# Patient Record
Sex: Female | Born: 1975 | Race: White | Hispanic: No | Marital: Single | Smoking: Never smoker
Health system: Southern US, Community
[De-identification: ages and names within clinical notes are randomized; demographics above are authoritative.]

## PROBLEM LIST (undated history)

## (undated) DIAGNOSIS — F419 Anxiety disorder, unspecified: Secondary | ICD-10-CM

## (undated) DIAGNOSIS — M1990 Unspecified osteoarthritis, unspecified site: Secondary | ICD-10-CM

## (undated) HISTORY — PX: SHOULDER SURGERY: SHX246

## (undated) HISTORY — PX: KNEE SURGERY: SHX244

## (undated) HISTORY — DX: Anxiety disorder, unspecified: F41.9

---

## 2004-10-24 ENCOUNTER — Ambulatory Visit: Payer: Self-pay | Admitting: Unknown Physician Specialty

## 2006-04-09 ENCOUNTER — Emergency Department: Payer: Self-pay | Admitting: Emergency Medicine

## 2010-05-23 ENCOUNTER — Emergency Department: Payer: Self-pay | Admitting: Emergency Medicine

## 2010-10-06 DIAGNOSIS — S83209A Unspecified tear of unspecified meniscus, current injury, unspecified knee, initial encounter: Secondary | ICD-10-CM | POA: Insufficient documentation

## 2010-10-06 DIAGNOSIS — M179 Osteoarthritis of knee, unspecified: Secondary | ICD-10-CM | POA: Insufficient documentation

## 2012-05-08 ENCOUNTER — Ambulatory Visit: Payer: Self-pay | Admitting: Specialist

## 2012-08-19 IMAGING — US US EXTREM LOW VENOUS*R*
1 series · 18 of 24 positions shown · non-contrast
Comparison: none

REASON FOR EXAM: right leg pain/swelling
COMMENTS:

PROCEDURE:     US  - US DOPPLER LOW EXTR RIGHT  - May 24, 2010  [DATE]
RESULT:     Right lower extremity color flow duplex Doppler reveals no
evidence of deep venous thrombosis. Color flow analysis, Doppler analysis
and compression studies are normal.

[Series 1: us extrem low venous*right* · 18 of 24 slices shown]
[im 1/24]
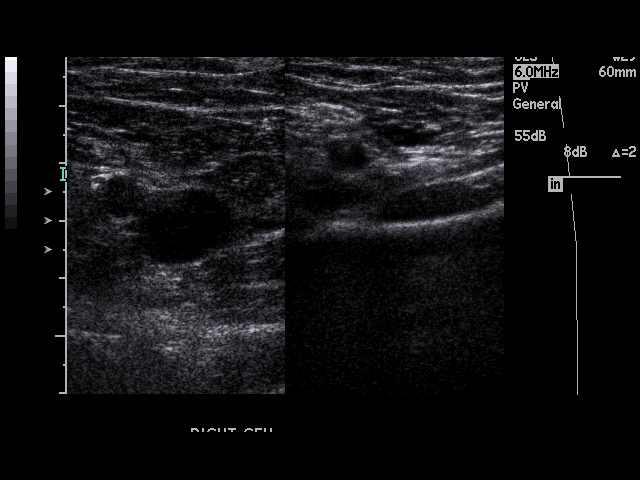
[im 3/24]
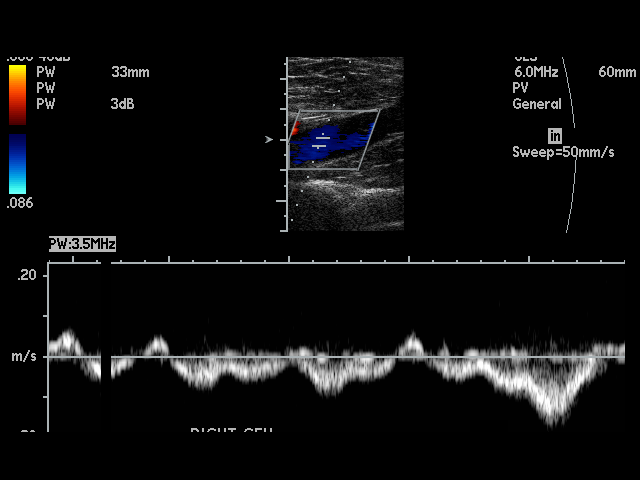
[im 4/24]
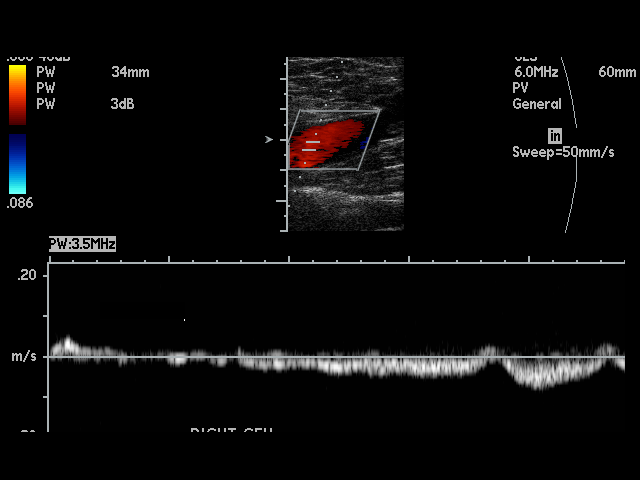
[im 5/24]
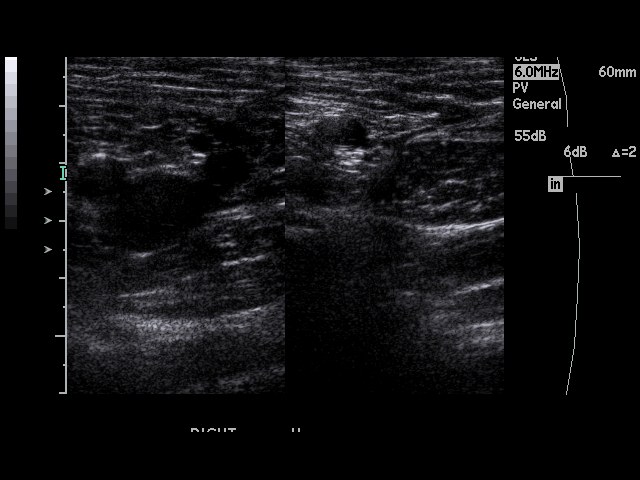
[im 7/24]
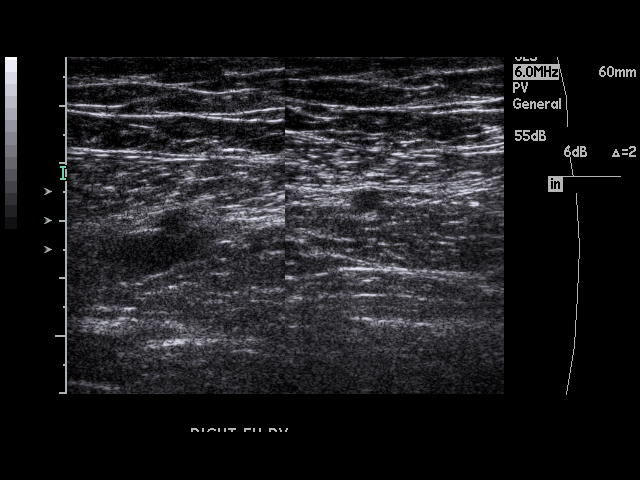
[im 8/24]
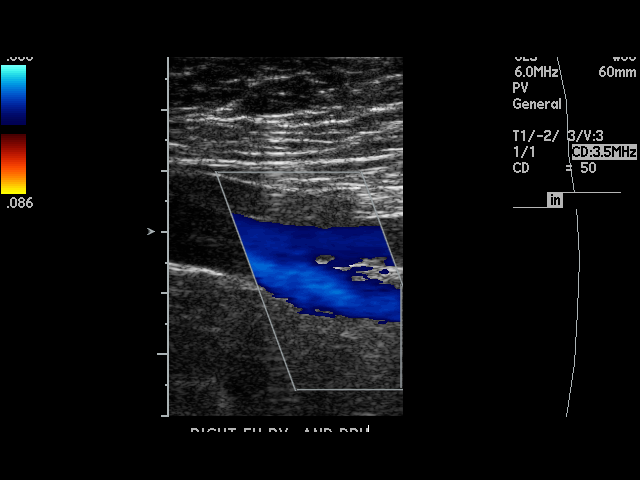
[im 9/24]
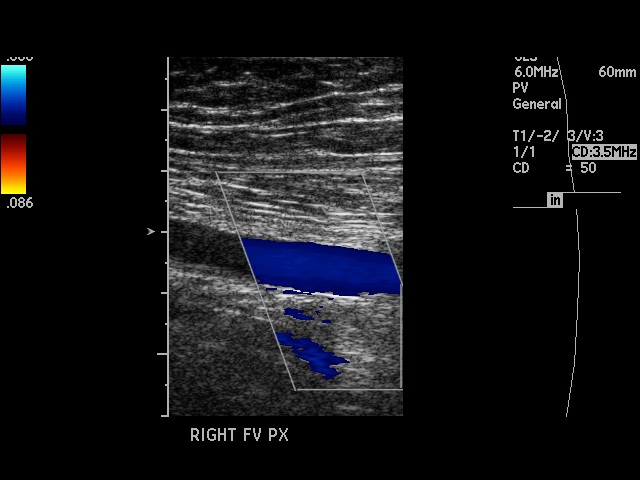
[im 11/24]
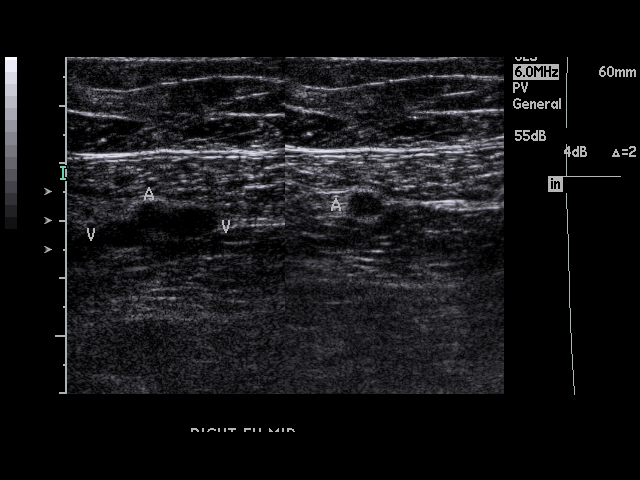
[im 12/24]
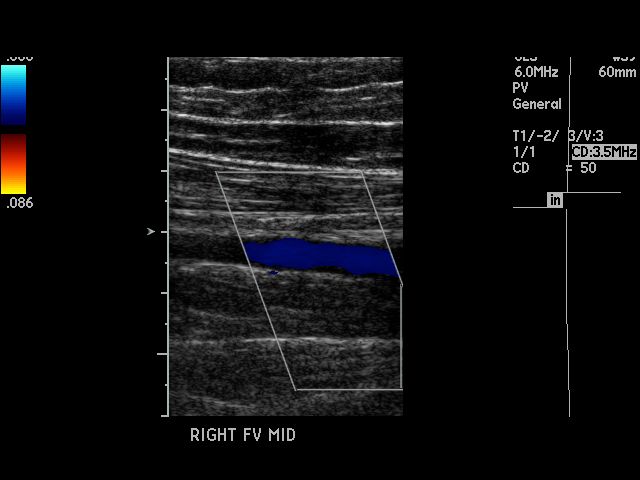
[im 13/24]
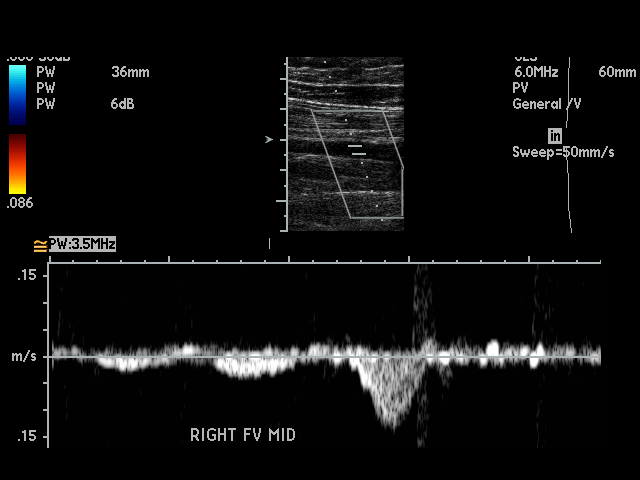
[im 15/24]
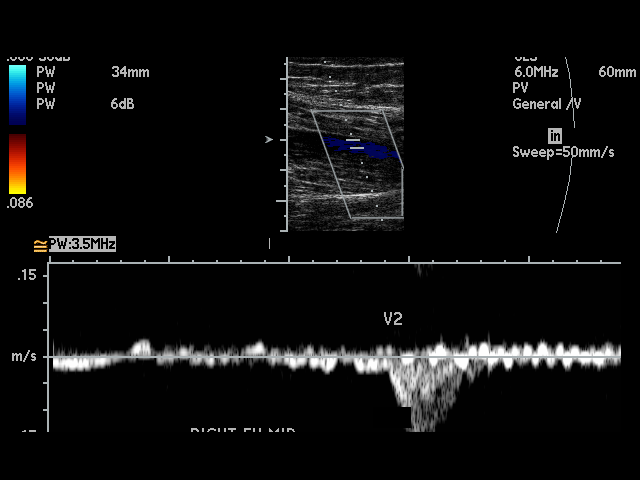
[im 16/24]
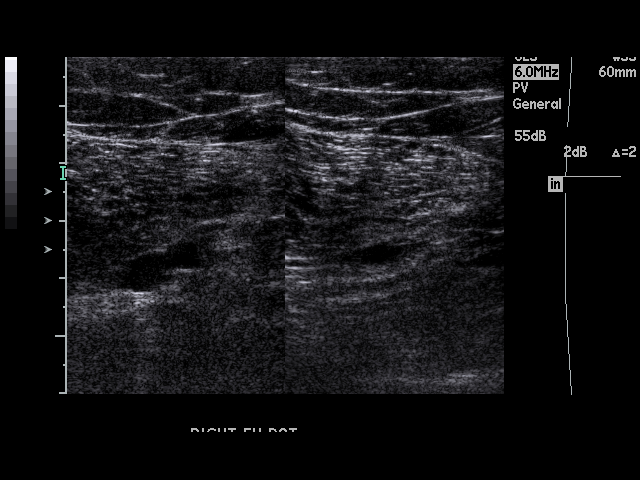
[im 17/24]
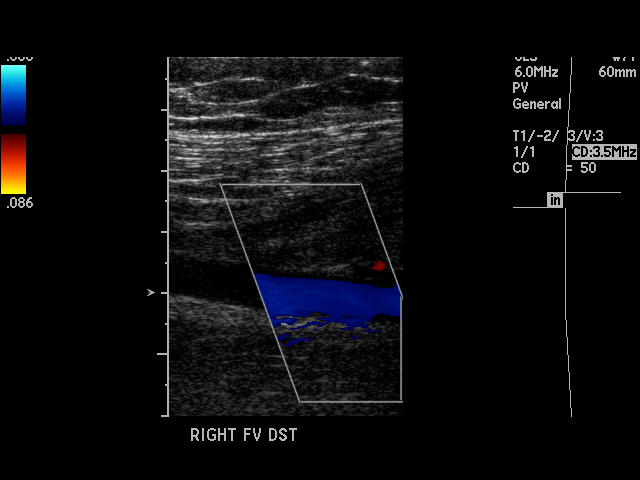
[im 19/24]
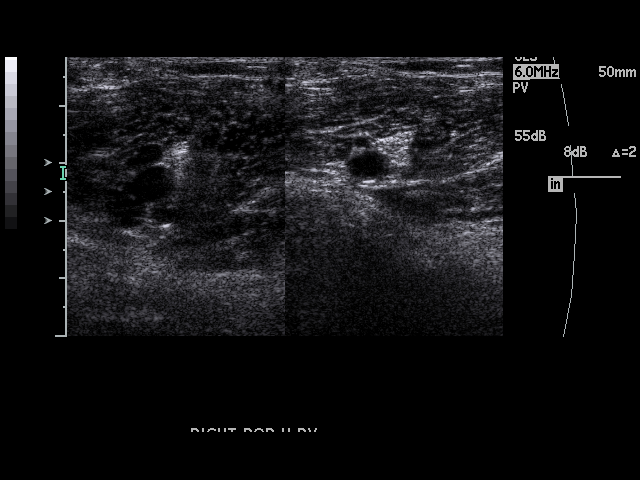
[im 20/24]
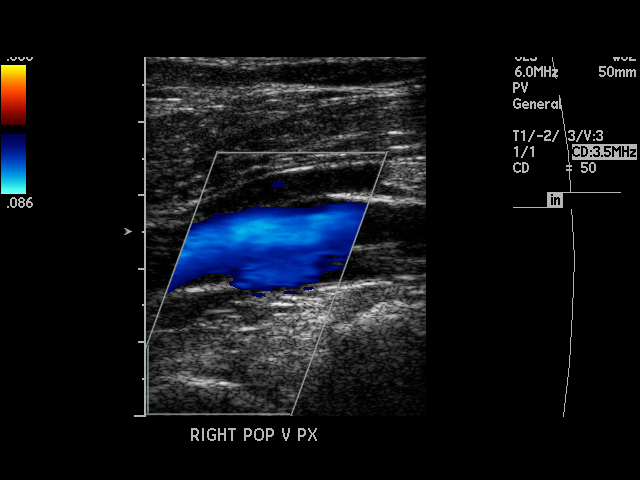
[im 21/24]
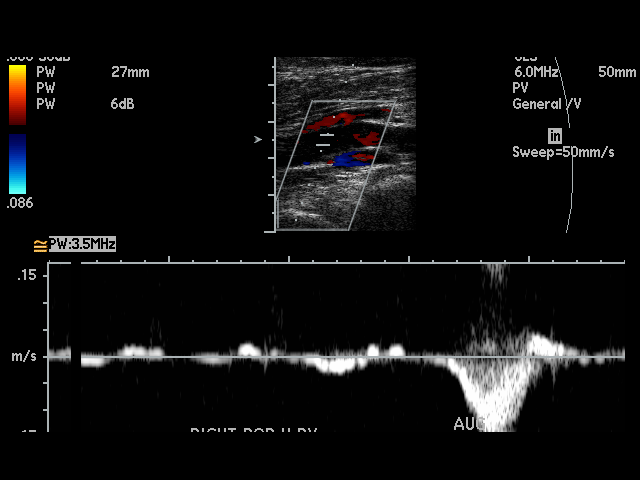
[im 23/24]
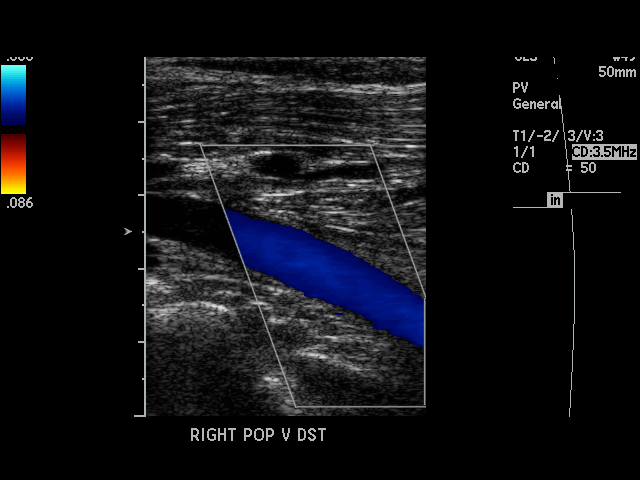
[im 24/24]
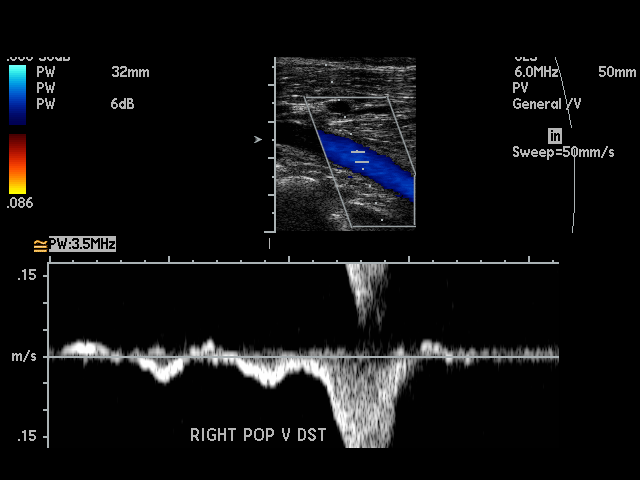

[18 of 24 positions shown; findings below may reference images not displayed]

IMPRESSION: Negative exam.

## 2013-03-31 ENCOUNTER — Ambulatory Visit: Payer: Self-pay | Admitting: Pain Medicine

## 2013-05-27 ENCOUNTER — Other Ambulatory Visit: Payer: Self-pay | Admitting: Pain Medicine

## 2013-05-27 LAB — SEDIMENTATION RATE: Erythrocyte Sed Rate: 9 mm/hr (ref 0–20)

## 2013-05-29 ENCOUNTER — Ambulatory Visit: Payer: Self-pay | Admitting: Pain Medicine

## 2013-06-23 ENCOUNTER — Ambulatory Visit: Payer: Self-pay | Admitting: Pain Medicine

## 2013-08-14 HISTORY — PX: SHOULDER SURGERY: SHX246

## 2014-09-11 DIAGNOSIS — Z9689 Presence of other specified functional implants: Secondary | ICD-10-CM | POA: Insufficient documentation

## 2014-12-01 NOTE — Op Note (Signed)
PATIENT NAME:  Christine White, Christine White MR#:  [id number] DATE OF BIRTH:  03-22-1976  DATE OF PROCEDURE:  05/08/2012  PREOPERATIVE DIAGNOSIS: Impingement syndrome, right shoulder.   POSTOPERATIVE DIAGNOSES:  1. Tear of the anterior labrum, of the right shoulder, at the 3 o'clock  position.  2. Impingement/bursitis, right subacromial space.   PROCEDURES PERFORMED:  1. Arthroscopic repair of the labrum using an ArthroCare speed screw.  2. Arthroscopic bursectomy of the subacromial space.   SURGEON: Valinda HoarHoward E. Ilanna Deihl, White.D.   ANESTHESIA: General endotracheal plus interscalene block.   COMPLICATIONS: None.   DRAINS: None.   ESTIMATED BLOOD LOSS: Minimal.   REPLACEMENTS: None.   OPERATIVE FINDINGS: The patient had a tear of the anterior labrum at the 3 o'clock position on the glenoid. There was soft tissue interfering with movement and a gap where the labrum had come off of the glenoid. The biceps tendon was totally normal. The rotator cuff was totally normal. The glenohumeral surfaces were normal. The posterior labrum was frayed. Subacromial space showed very thick bursal tissue. The rotator cuff was intact on the dorsum and there was no significant overhang of the anterior acromion.   DESCRIPTION OF PROCEDURE: The patient was brought to the Operating Room where she underwent satisfactory general endotracheal anesthesia, after a right interscalene block had been put in place. She was turned into the left lateral decubitus position and padded appropriately on the beanbag. The right shoulder was prepped and draped in sterile fashion and placed in 10 pounds of traction. Arthroscopy was carried out from a posterior portal with two accessory portals being made anteriorly and one laterally. The joint was first examined and the above findings were noted. The anterior labrum was debrided leaving a fairly wide gap between the labrum and the bone. Therefore I elected to repair this. A plastic cannula was  inserted anteriorly and the anterior glenoid was debrided down to bare bone. A #2 magnum wire was passed through the anterior labrum with a fast stitch device. This was brought out anteriorly. A drill guide was then introduced and the anterior glenoid was drilled to accept the anchor. The sutures were passed into the speed screw device and the device was advanced down to the anterior glenoid and positioned over the opening. It was then tapped into place. The sutures were tightened and the labrum was brought down adjacent to the glenoid. Once this was finished, the speed screw was deployed and instruments removed. The suture was cut short. This provided much better approximation of the labrum. The joint was irrigated and the arthroscope was redirected to the subacromial space. Through the anterolateral portal a motorized resector was introduced and an extensive amount of thickened bursal tissue was removed. The dorsal surface of the cuff was intact. There was not enough bone anteriorly to do a resection. Rather than create more raw bleeding surfaces, I elected to avoid acromioplasty and clavicle excision. She has not complained of pain over the [facility]. After thorough irrigation, the stab wounds were closed with 3-0 nylon. 0.25% Marcaine with epinephrine and morphine was placed in the joint and the bursa. A dry sterile dressing and sling were applied. The patient was taken out of traction prior to this. The patient was then awakened and taken to recovery in good condition. ____________________________ Valinda HoarHoward E. Tomasz Steeves, MD hem:slb D: 05/08/2012 15:19:46 ET T: 05/08/2012 16:34:17 ET JOB#: [id number]  cc: Valinda HoarHoward E. Demarious Kapur, MD, <Dictator> Valinda HoarHOWARD E Kierria Feigenbaum MD ELECTRONICALLY SIGNED 05/09/2012 13:42

## 2015-07-21 ENCOUNTER — Emergency Department
Admission: EM | Admit: 2015-07-21 | Discharge: 2015-07-21 | Disposition: A | Discharge status: Home / Self Care | Payer: Worker's Compensation | Attending: Emergency Medicine | Admitting: Emergency Medicine

## 2015-07-21 ENCOUNTER — Encounter: Payer: Self-pay | Admitting: Emergency Medicine

## 2015-07-21 ENCOUNTER — Emergency Department: Payer: Worker's Compensation

## 2015-07-21 DIAGNOSIS — IMO0001 Reserved for inherently not codable concepts without codable children: Secondary | ICD-10-CM

## 2015-07-21 DIAGNOSIS — S63257A Unspecified dislocation of left little finger, initial encounter: Secondary | ICD-10-CM | POA: Diagnosis not present

## 2015-07-21 DIAGNOSIS — W228XXA Striking against or struck by other objects, initial encounter: Secondary | ICD-10-CM | POA: Diagnosis not present

## 2015-07-21 DIAGNOSIS — Y9289 Other specified places as the place of occurrence of the external cause: Secondary | ICD-10-CM | POA: Diagnosis not present

## 2015-07-21 DIAGNOSIS — Y9389 Activity, other specified: Secondary | ICD-10-CM | POA: Insufficient documentation

## 2015-07-21 DIAGNOSIS — S62627A Displaced fracture of medial phalanx of left little finger, initial encounter for closed fracture: Secondary | ICD-10-CM | POA: Insufficient documentation

## 2015-07-21 DIAGNOSIS — Y998 Other external cause status: Secondary | ICD-10-CM | POA: Insufficient documentation

## 2015-07-21 DIAGNOSIS — G8911 Acute pain due to trauma: Secondary | ICD-10-CM

## 2015-07-21 DIAGNOSIS — S6992XA Unspecified injury of left wrist, hand and finger(s), initial encounter: Secondary | ICD-10-CM | POA: Diagnosis present

## 2015-07-21 DIAGNOSIS — S62629A Displaced fracture of medial phalanx of unspecified finger, initial encounter for closed fracture: Secondary | ICD-10-CM

## 2015-07-21 HISTORY — DX: Unspecified osteoarthritis, unspecified site: M19.90

## 2015-07-21 MED ORDER — MELOXICAM 15 MG PO TABS
15.0000 mg | ORAL_TABLET | Freq: Every day | ORAL | Status: DC
Start: 2015-07-21 — End: 2023-04-12

## 2015-07-21 MED ORDER — BUPIVACAINE HCL 0.5 % IJ SOLN
50.0000 mL | Freq: Once | INTRAMUSCULAR | Status: DC
  Filled 2015-07-21: qty 50

## 2015-07-21 MED ORDER — BUPIVACAINE HCL (PF) 0.5 % IJ SOLN
INTRAMUSCULAR | Status: AC
  Filled 2015-07-21: qty 30

## 2015-07-21 MED ORDER — LIDOCAINE HCL (PF) 1 % IJ SOLN
5.0000 mL | Freq: Once | INTRAMUSCULAR | Status: DC
  Filled 2015-07-21: qty 5

## 2015-07-21 NOTE — ED Notes (Signed)
Pt injured left hand at work.

## 2015-07-21 NOTE — ED Provider Notes (Signed)
[facility] Emergency Department Provider Note ____________________________________________  Time seen: Approximately 6:43 PM  I have reviewed the triage vital signs and the nursing notes.   HISTORY  Chief Complaint Hand Pain   HPI Christine White is a 39 y.o. female who presents to the emergency department for evaluation of left 5th digit pain. She went to reach for something and hit her left 5th digit on a metal shelf. Pain immediately afterward. Deformity is obvious. She has not taken anything for pain .   Past Medical History  Diagnosis Date  . Arthritis     There are no active problems to display for this patient.   Past Surgical History  Procedure Laterality Date  . Knee surgery    . Shoulder surgery      Current Outpatient Rx  Name  Route  Sig  Dispense  Refill  . meloxicam (MOBIC) 15 MG tablet   Oral   Take 1 tablet (15 mg total) by mouth daily.   30 tablet   0     Allergies Neosporin and Vicodin  No family history on file.  Social History Social History  Substance Use Topics  . Smoking status: Never Smoker   . Smokeless tobacco: Never Used  . Alcohol Use: No    Review of Systems Constitutional: No recent illness. Eyes: No visual changes. ENT: No sore throat. Cardiovascular: Denies chest pain or palpitations. Respiratory: Denies shortness of breath. Gastrointestinal: No abdominal pain.  Genitourinary: Negative for dysuria. Musculoskeletal: Pain in left 5th digit. Skin: Negative for rash. Neurological: Negative for headaches, focal weakness or numbness. 10-point ROS otherwise negative.  ____________________________________________   PHYSICAL EXAM:  VITAL SIGNS: ED Triage Vitals  Enc Vitals Group     BP 07/21/15 1851 117/84 mmHg     Pulse Rate 07/21/15 1851 96     Resp 07/21/15 1851 18     Temp 07/21/15 1851 97.6 F (36.4 C)     Temp src --      SpO2 07/21/15 1851 98 %     Weight 07/21/15 1851 192 lb  (87.091 kg)     Height 07/21/15 1851 5\' 3"  (1.6 m)     Head Cir --      Peak Flow --      Pain Score 07/21/15 1847 9     Pain Loc --      Pain Edu? --      Excl. in GC? --     Constitutional: Alert and oriented. Well appearing and in no acute distress. Eyes: Conjunctivae are normal. EOMI. Head: Atraumatic. Nose: No congestion/rhinnorhea. Neck: No stridor.  Respiratory: Normal respiratory effort.   Musculoskeletal: Deformity noted to PIP of left 5th digit.  Neurologic:  Normal speech and language. No gross focal neurologic deficits are appreciated. Speech is normal. No gait instability. Skin:  Skin is warm, dry and intact. Closed, early ecchymosis noted to the 5th digit left hand. Psychiatric: Mood and affect are normal. Speech and behavior are normal.  ____________________________________________   LABS (all labs ordered are listed, but only abnormal results are displayed)  Labs Reviewed - No data to display ____________________________________________  RADIOLOGY  CLINICAL DATA: Postreduction  EXAM: LEFT FIFTH FINGER 2+V  COMPARISON: Study obtained earlier in the day  FINDINGS: Frontal, oblique and lateral views were obtained. The previously noted fifth PIP joint dislocation has been reduced successfully. There are several small avulsion fracture fragments within the PIP joint region. No new fracture or dislocation. Joint spaces appear  intact.  IMPRESSION: Several small fracture fragments are noted in the PIP joint region. The previously noted PIP joint dislocation has been reduced successfully. Currently no dislocation or new fracture. No arthropathic change.  Comment: A small focal area of ossification adjacent to the ulnar styloid is again noted consistent with age uncertain prior injury in this area.  ____________________________________________   PROCEDURES  Procedure(s) performed:   5th digit of left hand was splinted by ER tech. Aluminum foam  splint applied and buddy taped to the 4th digit. Neurovascularly intact post application.   ____________________________________________   INITIAL IMPRESSION / ASSESSMENT AND PLAN / ED COURSE  Pertinent labs & imaging results that were available during my care of the patient were reviewed by me and considered in my medical decision making (see chart for details).  Patient was advised to call and schedule an appointment with orthopedics. She was advised not to remove the splint. She was advised to return to the emergency department for symptoms that change or worsen if she is unable schedule an appointment. ____________________________________________   FINAL CLINICAL IMPRESSION(S) / ED DIAGNOSES  Final diagnoses:  Acute pain due to injury  Dislocation  Avulsion fracture of middle phalanx of finger, closed, initial encounter       Christine Pester, FNP 07/21/15 2216  Emily Filbert, MD 07/21/15 2232

## 2016-02-03 ENCOUNTER — Ambulatory Visit: Payer: Self-pay | Admitting: Family Medicine

## 2016-02-18 ENCOUNTER — Ambulatory Visit: Payer: Self-pay | Admitting: Family Medicine

## 2016-07-17 ENCOUNTER — Ambulatory Visit: Payer: Self-pay | Admitting: Family Medicine

## 2016-08-16 ENCOUNTER — Ambulatory Visit: Payer: Self-pay | Admitting: Family Medicine

## 2017-10-16 IMAGING — CR DG FINGER LITTLE 2+V*L*
1 series · 3 of 3 positions shown · non-contrast
Comparison: None.

CLINICAL DATA: Injury to left pinky at work. Jammed finger on metal
Athus, with deformity and pain. Initial encounter.

EXAM:
LEFT LITTLE FINGER 2+V

[Series 1: x finger pa left · 0.14mm/px · 3 of 3 slices shown]
[im 1/3]
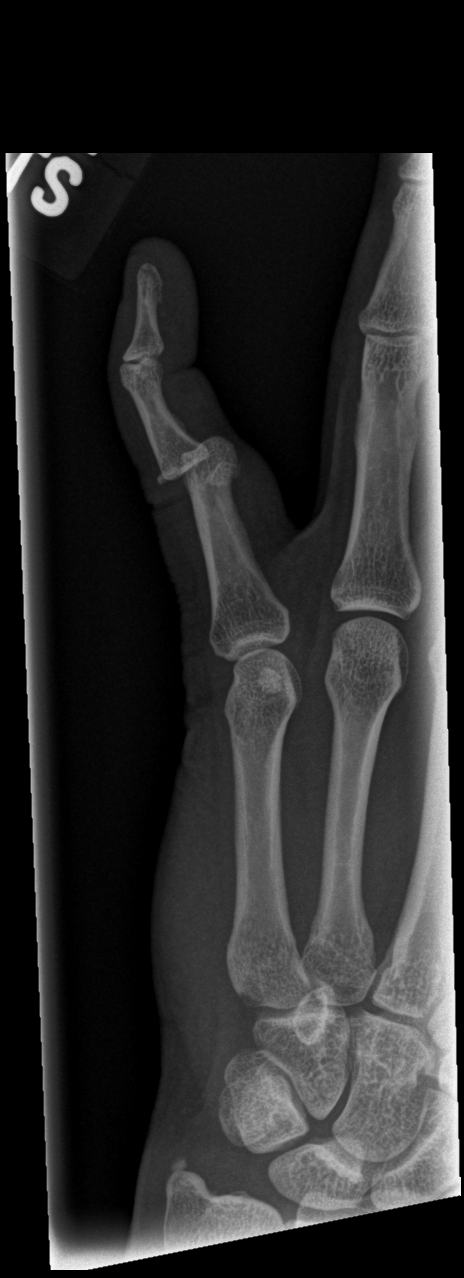
[im 2/3]
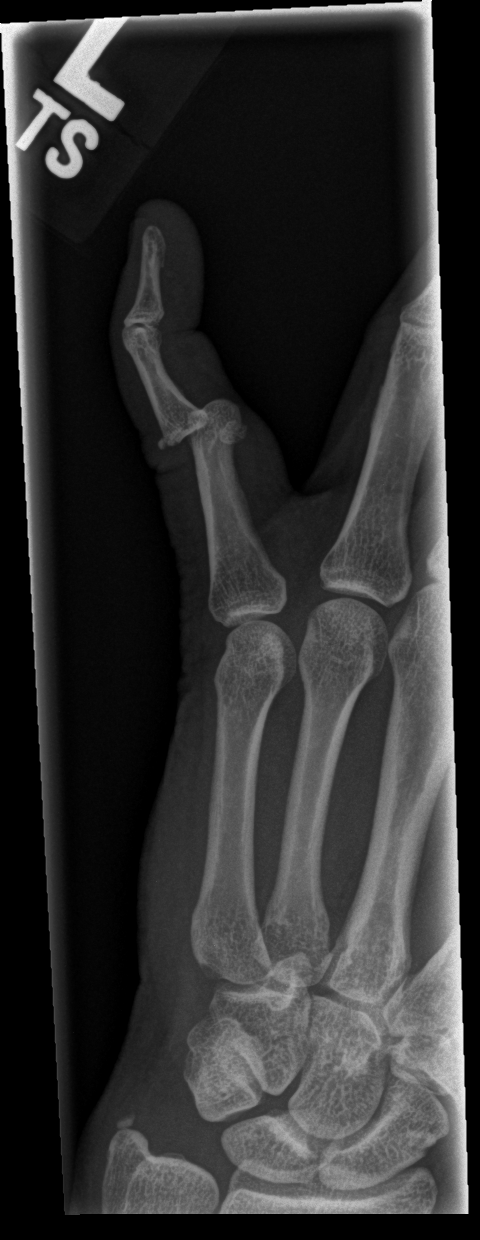
[im 3/3]
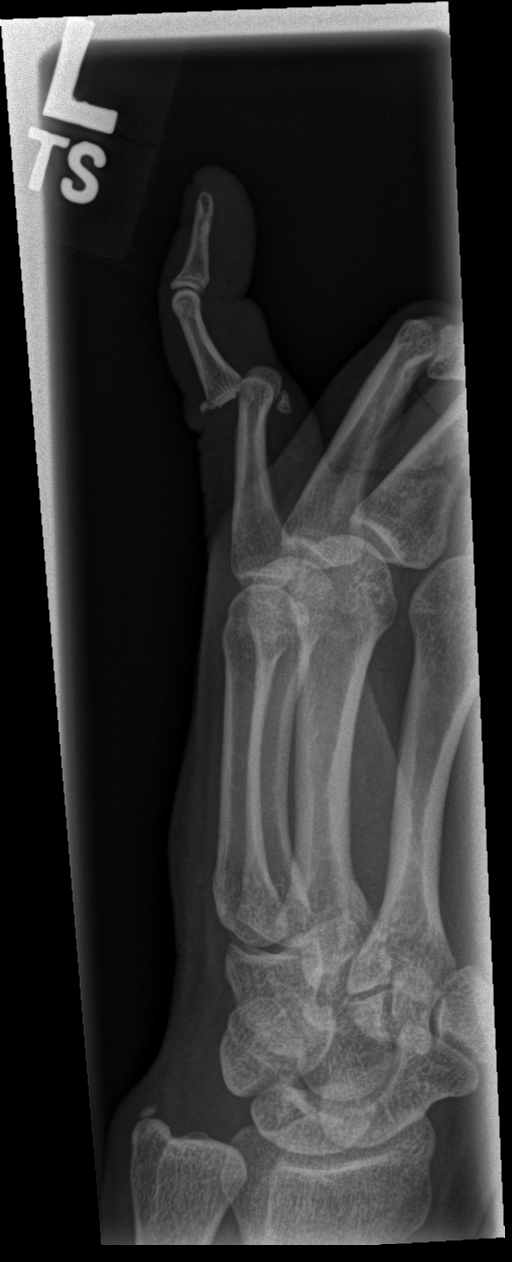

[3 of 3 positions shown; findings below may reference images not displayed]

FINDINGS: There is dorsal and ulnar dislocation of the fifth middle phalanx.
The base of the middle phalanx is lodged against the dorsal aspect
of the proximal phalanx. A few small associated dorsal osseous
fragments are seen at the site of dislocation, reflecting small
fractures.

Remaining visualized joint spaces are unremarkable. The carpal rows
appear grossly intact. A chronic small osseous fragment is noted at
the ulnar styloid.
IMPRESSION: Dorsal and ulnar dislocation of the fifth middle phalanx. The base
of the middle phalanx is lodged against the dorsal aspect of the
proximal phalanx. Few small associated dorsal osseous fragments
noted at the site of dislocation, reflecting small fractures.

## 2022-04-12 ENCOUNTER — Ambulatory Visit: Payer: Self-pay | Admitting: Nurse Practitioner

## 2022-08-09 ENCOUNTER — Ambulatory Visit: Payer: Self-pay | Admitting: Nurse Practitioner

## 2023-03-19 ENCOUNTER — Ambulatory Visit: Payer: Self-pay | Admitting: Nurse Practitioner

## 2023-04-05 NOTE — Progress Notes (Signed)
New patient visit   Patient: Christine White   DOB: [birth year]   47 y.o. Female  MRN: [id number] Visit Date: 04/12/2023  Today's healthcare provider: Ronnald Ramp, MD   Chief Complaint  Patient presents with   New Patient (Initial Visit)    High anxiety and not sleeping good at night due to high stress at work. Patient also    Subjective    Christine White is a 47 y.o. female who presents today as a new patient to establish care.   HPI     New Patient (Initial Visit)    Additional comments: High anxiety and not sleeping good at night due to high stress at work. Patient also       Last edited by Acey Lav, CMA on 04/12/2023  9:29 AM.       Discussed the use of AI scribe software for clinical note transcription with the patient, who gave verbal consent to proceed.  History of Present Illness   The patient, employed at [employer], presents with a history of multiple orthopedic surgeries, including shoulder surgery, multiple knee surgeries, and the insertion of a knee stimulator due to bone-on-bone contact in the right leg. The knee stimulator, implanted by a pain clinic, has significantly reduced his need for pain medication. However, he reports that the stimulator's battery is currently defective and requires replacement.  He also reports a high tolerance for pain, citing instances of self-managing dislocations and fractures in the past. Despite these experiences, he maintains an active lifestyle, walking extensively at work and engaging in physical activities with family members.  His work involves significant responsibility and multitasking, which he reports can lead to feelings of anxiety. He describes his anxiety as being constantly "on," with difficulty focusing and a need to complete tasks to his satisfaction. This anxiety does not manifest as panic attacks but can escalate to high levels of frustration.  His most recent lab work, conducted through  his workplace, indicated borderline cholesterol levels. He reports maintaining an active lifestyle and has plans to improve his cholesterol levels through increased physical activity.  He also reports occasional difficulty sleeping, attributing this to his high-stress job and the responsibility of caring for his mother. He has not been formally diagnosed with any sleep or anxiety disorders but identifies with symptoms of ADHD.  His insurance is expected to cover this procedure. His last significant illness was an upper respiratory infection approximately five years ago.          Past Medical History:  Diagnosis Date   Anxiety    Arthritis    Past Surgical History:  Procedure Laterality Date   KNEE SURGERY Right    4-5 different   SHOULDER SURGERY     SHOULDER SURGERY Right 2015   Family Status  Relation Name Status   Mother  (Not Specified)   Father  (Not Specified)   MGM  (Not Specified)   MGF  (Not Specified)  No partnership data on file   Family History  Problem Relation Age of Onset   Thyroid disease Mother 85 - 79   Diabetes type I Mother    Anemia Mother    Thyroid cancer Father 6 - 36   Ovarian cancer Maternal Grandmother        progressed to others   Diabetes Maternal Grandmother    Diabetes Maternal Grandfather    Social History   Socioeconomic History   Marital status: Single    Spouse name: Not on  file   Number of children: Not on file   Years of education: Not on file   Highest education level: Not on file  Occupational History   Not on file  Tobacco Use   Smoking status: Never   Smokeless tobacco: Never  Substance and Sexual Activity   Alcohol use: No   Drug use: No   Sexual activity: Not on file  Other Topics Concern   Not on file  Social History Narrative   Not on file   Social Determinants of Health   Financial Resource Strain: Not on file  Food Insecurity: Not on file  Transportation Needs: Not on file  Physical Activity: Not on  file  Stress: Not on file  Social Connections: Not on file    Outpatient Medications Prior to Visit  Medication Sig   [DISCONTINUED] Doxepin HCl 6 MG TABS Take by mouth. (Patient not taking: Reported on 04/12/2023)   [DISCONTINUED] fluticasone (FLONASE) 50 MCG/ACT nasal spray Place into the nose. (Patient not taking: Reported on 04/12/2023)   [DISCONTINUED] meloxicam (MOBIC) 15 MG tablet Take 1 tablet (15 mg total) by mouth daily. (Patient not taking: Reported on 04/12/2023)   [DISCONTINUED] tiZANidine (ZANAFLEX) 2 MG tablet Take by mouth. (Patient not taking: Reported on 04/12/2023)   No facility-administered medications prior to visit.   Allergies  Allergen Reactions   Egg Solids, Whole Hives   Hydrocodone-Acetaminophen Hives    "Loopy Feeling"   Neosporin [Neomycin-Bacitracin Zn-Polymyx] Hives   Other Itching   Vicodin [Hydrocodone-Acetaminophen] Other (See Comments)    jittery   Meloxicam Hives, Itching and Rash    Severe rash and itching all over     There is no immunization history on file for this patient.  Health Maintenance  Topic Date Due   HIV Screening  Never done   Hepatitis C Screening  Never done   DTaP/Tdap/Td (1 - Tdap) Never done   PAP SMEAR-Modifier  Never done   Colonoscopy  Never done   COVID-19 Vaccine (1 - 2023-24 season) Never done   INFLUENZA VACCINE  11/12/2023 (Originally 03/15/2023)   HPV VACCINES  Aged Out    Patient Care Team: Ronnald Ramp, MD as PCP - General (Family Medicine)  Review of Systems  Last CBC No results found for: "WBC", "HGB", "HCT", "MCV", "MCH", "RDW", "PLT" Last metabolic panel No results found for: "GLUCOSE", "NA", "K", "CL", "CO2", "BUN", "CREATININE", "EGFR", "CALCIUM", "PHOS", "PROT", "ALBUMIN", "LABGLOB", "AGRATIO", "BILITOT", "ALKPHOS", "AST", "ALT", "ANIONGAP" Last lipids No results found for: "CHOL", "HDL", "LDLCALC", "LDLDIRECT", "TRIG", "CHOLHDL" Last hemoglobin A1c No results found for:  "HGBA1C" Last thyroid functions No results found for: "TSH", "T3TOTAL", "T4TOTAL", "THYROIDAB"      Objective    BP 122/74   Pulse 66   Ht 5\' 3"  (1.6 m)   Wt 246 lb 12.8 oz (111.9 kg)   SpO2 99%   BMI 43.72 kg/m  BP Readings from Last 3 Encounters:  04/12/23 122/74  07/21/15 117/84   Wt Readings from Last 3 Encounters:  04/12/23 246 lb 12.8 oz (111.9 kg)  07/21/15 192 lb (87.1 kg)        Depression Screen    04/12/2023    9:28 AM  PHQ 2/9 Scores  PHQ - 2 Score 0   No results found for any visits on 04/12/23.   Physical Exam Vitals reviewed.  Constitutional:      General: She is not in acute distress.    Appearance: Normal appearance. She is normal weight. She  is not ill-appearing, toxic-appearing or diaphoretic.     Comments: Well groomed, calmly sitting in exam rooom  Eyes:     Conjunctiva/sclera: Conjunctivae normal.  Cardiovascular:     Rate and Rhythm: Normal rate and regular rhythm.     Pulses: Normal pulses.     Heart sounds: Normal heart sounds. No murmur heard.    No friction rub. No gallop.  Pulmonary:     Effort: Pulmonary effort is normal. No respiratory distress.     Breath sounds: Normal breath sounds. No stridor. No wheezing, rhonchi or rales.  Abdominal:     General: Bowel sounds are normal. There is no distension.     Palpations: Abdomen is soft.     Tenderness: There is no abdominal tenderness.  Musculoskeletal:     Right lower leg: No edema.     Left lower leg: No edema.  Skin:    Findings: No erythema or rash.  Neurological:     Mental Status: She is alert and oriented to person, place, and time.  Psychiatric:        Attention and Perception: Attention and perception normal. She is attentive. She does not perceive auditory or visual hallucinations.        Mood and Affect: Mood and affect normal.        Speech: Speech normal.        Behavior: Behavior normal. Behavior is cooperative.        Thought Content: Thought content normal.  Thought content is not paranoid or delusional. Thought content does not include homicidal or suicidal ideation. Thought content does not include homicidal or suicidal plan.        Judgment: Judgment normal.        Assessment & Plan      Problem List Items Addressed This Visit     Anxiety - Primary    Reports high stress levels related to work and caregiving responsibilities. Describes difficulty winding down and poor sleep. No panic attacks reported. -Start Buspar 7.5mg  twice daily as needed for anxiety. -Schedule virtual follow-up in 3 weeks to assess response to medication.      Relevant Medications   busPIRone (BUSPAR) 7.5 MG tablet   Establishing care with new doctor, encounter for    Welcomed patient to [facility]  Reviewed patient's medical history, medications, surgical and social history Discussed roles and expectations for primary care physician-patient relationship Recommended patient schedule annual preventative examinations        Healthcare maintenance    Colon cancer screening GI referral for colonoscopy submitted  Request patient to send recent lab results via MyChart for review. Schedule physical exam in February 2025.        Knee osteoarthritis    History of multiple knee surgeries including meniscectomy and meniscus replacement. Currently has a knee stimulator implant for pain control. Reports the battery needs replacement. -Refer to Gen surgery for evaluation and management of knee stimulator battery replacement.      Neuropathic pain (Chronic)    Chronic  Referral to Gen surg for stimulator replacement       Relevant Orders   Ambulatory referral to General Surgery   Spinal cord stimulator status    Chronic  Chronic knee pain symptoms are well controlled with use of stimulator  In need of battery replacement       Relevant Orders   Ambulatory referral to General Surgery   Other Visit Diagnoses     Screening for colon cancer  Relevant Orders   Ambulatory referral to Gastroenterology       Return in about 3 weeks (around 05/03/2023) for Anxiety (virtual) and CPE see note below please .      Ronnald Ramp, MD  [facility] [phone] (phone) [phone] (fax)  [facility]

## 2023-04-12 ENCOUNTER — Encounter: Payer: Self-pay | Admitting: Family Medicine

## 2023-04-12 ENCOUNTER — Ambulatory Visit: Payer: Commercial Managed Care - PPO | Admitting: Family Medicine

## 2023-04-12 VITALS — BP 122/74 | HR 66 | Ht 63.0 in | Wt 246.8 lb

## 2023-04-12 DIAGNOSIS — Z1211 Encounter for screening for malignant neoplasm of colon: Secondary | ICD-10-CM

## 2023-04-12 DIAGNOSIS — M792 Neuralgia and neuritis, unspecified: Secondary | ICD-10-CM | POA: Diagnosis not present

## 2023-04-12 DIAGNOSIS — Z0000 Encounter for general adult medical examination without abnormal findings: Secondary | ICD-10-CM

## 2023-04-12 DIAGNOSIS — Z7689 Persons encountering health services in other specified circumstances: Secondary | ICD-10-CM

## 2023-04-12 DIAGNOSIS — F419 Anxiety disorder, unspecified: Secondary | ICD-10-CM | POA: Diagnosis not present

## 2023-04-12 DIAGNOSIS — M170 Bilateral primary osteoarthritis of knee: Secondary | ICD-10-CM | POA: Diagnosis not present

## 2023-04-12 DIAGNOSIS — Z9689 Presence of other specified functional implants: Secondary | ICD-10-CM

## 2023-04-12 DIAGNOSIS — M92523 Juvenile osteochondrosis of tibia tubercle, bilateral: Secondary | ICD-10-CM | POA: Insufficient documentation

## 2023-04-12 MED ORDER — BUSPIRONE HCL 7.5 MG PO TABS: 7.5000 mg | ORAL_TABLET | Freq: Two times a day (BID) | ORAL | 3 refills | Status: AC

## 2023-04-12 NOTE — Assessment & Plan Note (Signed)
Reports high stress levels related to work and caregiving responsibilities. Describes difficulty winding down and poor sleep. No panic attacks reported. -Start Buspar 7.5mg  twice daily as needed for anxiety. -Schedule virtual follow-up in 3 weeks to assess response to medication.

## 2023-04-12 NOTE — Assessment & Plan Note (Signed)
Chronic  Referral to Gen surg for stimulator replacement

## 2023-04-12 NOTE — Assessment & Plan Note (Addendum)
Colon cancer screening GI referral for colonoscopy submitted  Request patient to send recent lab results via MyChart for review. Schedule physical exam in February 2025.

## 2023-04-12 NOTE — Assessment & Plan Note (Signed)
History of multiple knee surgeries including meniscectomy and meniscus replacement. Currently has a knee stimulator implant for pain control. Reports the battery needs replacement. -Refer to Gen surgery for evaluation and management of knee stimulator battery replacement.

## 2023-04-12 NOTE — Assessment & Plan Note (Addendum)
Chronic  Chronic knee pain symptoms are well controlled with use of stimulator  In need of battery replacement

## 2023-04-12 NOTE — Assessment & Plan Note (Signed)
Welcomed patient to [facility]  Reviewed patient's medical history, medications, surgical and social history Discussed roles and expectations for primary care physician-patient relationship Recommended patient schedule annual preventative examinations   

## 2023-04-13 ENCOUNTER — Telehealth: Payer: Self-pay

## 2023-04-13 NOTE — Telephone Encounter (Signed)
Pt called in says brand of stimulator is [employer].

## 2023-04-13 NOTE — Telephone Encounter (Signed)
Copied from CRM [id number]. Topic: General - Inquiry >> Apr 13, 2023  9:53 AM De Blanch wrote: Reason for CRM: Marena Chancy from [facility] is calling regarding pt's referral.  Stated Doctor reviewed the referral and wants to know what the brand of the stimulator is. Please advise.

## 2023-04-13 NOTE — Telephone Encounter (Signed)
Contacted to verify what brand as Dr.Robinson is out of office today. LVMTCB. CRM created. Ok for [facility] to verify if the patient is aware of what brand.

## 2023-04-24 ENCOUNTER — Encounter: Payer: Self-pay | Admitting: *Deleted

## 2023-05-03 ENCOUNTER — Telehealth (INDEPENDENT_AMBULATORY_CARE_PROVIDER_SITE_OTHER): Payer: Commercial Managed Care - PPO | Admitting: Physician Assistant

## 2023-05-03 DIAGNOSIS — F419 Anxiety disorder, unspecified: Secondary | ICD-10-CM | POA: Diagnosis not present

## 2023-05-03 NOTE — Progress Notes (Signed)
MyChart Video Visit  Virtual Visit via Video Note   This format is felt to be most appropriate for this patient at this time. Physical exam was limited by quality of the video and audio technology used for the visit.   Patient location: office Patient Location: Home  I discussed the limitations of evaluation and management by telemedicine and the availability of in person appointments. The patient expressed understanding and agreed to proceed.  Patient: Christine White   DOB: Nov 05, 1975   47 y.o. Female  MRN: [id number] Visit Date: 05/03/2023  Today's healthcare provider: Debera Lat, PA-C   Chief Complaint  Patient presents with   Medical Management of Chronic Issues    3 week follow up on depression, medication is ineffective giving constant headaches, would like an alternatives   Subjective     Anxiety: Patient complains of anxiety disorder.  She has the following symptoms: psychomotor agitation, racing thoughts, worry too much , poor sleep, difficulty winding down. Pt reported all these symptoms during her initial visit as a new pat on 04/12/23 unchanged since that time. 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 Previous treatment includes BuSpar .  She complains of the following side effects from the treatment: headache.        05/03/2023    3:07 PM 04/12/2023    9:29 AM  GAD 7 : Generalized Anxiety Score  Nervous, Anxious, on Edge 3 2  Control/stop worrying 2 0  Worry too much - different things 3 3  Trouble relaxing 3 1  Restless 3 0  Easily annoyed or irritable 1 0  Afraid - awful might happen 0 0  Total GAD 7 Score 15 6  Anxiety Difficulty  Not difficult at all       04/12/2023    9:28 AM  PHQ9 SCORE ONLY  PHQ-9 Total Score 0       Medications: Outpatient Medications Prior to Visit  Medication Sig   busPIRone (BUSPAR) 7.5  MG tablet Take 1 tablet (7.5 mg total) by mouth 2 (two) times daily.   No facility-administered medications prior to visit.    Review of Systems  All other systems reviewed and are negative.  Except see HPI       Objective    There were no vitals taken for this visit.      Physical Exam Constitutional:      General: She is not in acute distress.    Appearance: Normal appearance.  HENT:     Head: Normocephalic.  Pulmonary:     Effort: Pulmonary effort is normal. No respiratory distress.  Neurological:     Mental Status: She is alert and oriented to person, place, and time. Mental status is at baseline.        Assessment & Plan    1. Anxiety Chronic? Situational? Worsening D/c taking buspar due to side effects Start a trial of : - escitalopram (LEXAPRO) 10 MG tablet; Take 1 tablet (10 mg total) by mouth daily.  Dispense: 30 tablet; Refill: 0. Benefits and risks were discussed. Delayed treatment may result in poorer clinical outcomes compared with patients treated within 1 year of symptom onset.Patient agreed and expressed his understanding. Advised to start with 1 tablet once daily and increase to twice daily if symptoms persist in a week or two.   Relaxation/mindfulness techniques were recommended. Referral to psychology or to psychiatry will  be placed if pt agrees.  The patient was advised to call back or seek an in-person evaluation if the symptoms worsen or if the condition fails to improve as anticipated.   Advised to schedule an appt with Dr. Roxan Hockey in 4-6 weeks for reassessment        No follow-ups on file.     I discussed the assessment and treatment plan with the patient. The patient was provided an opportunity to ask questions and all were answered. The patient agreed with the plan and demonstrated an understanding of the instructions.   The patient was advised to call back or seek an in-person evaluation if the symptoms worsen or if the condition  fails to improve as anticipated.  I provided 29 minutes of non-face-to-face time during this encounter.  I, Debera Lat, PA-C have reviewed all documentation for this visit. The documentation on  05/03/23 for the exam, diagnosis, procedures, and orders are all accurate and complete.  Debera Lat, [facility], MMS [facility] [phone] (phone) [phone] (fax)  [facility]

## 2023-05-04 ENCOUNTER — Other Ambulatory Visit: Payer: Self-pay | Admitting: Family Medicine

## 2023-05-04 DIAGNOSIS — F419 Anxiety disorder, unspecified: Secondary | ICD-10-CM

## 2023-05-05 ENCOUNTER — Encounter: Payer: Self-pay | Admitting: Physician Assistant

## 2023-05-05 MED ORDER — ESCITALOPRAM OXALATE 10 MG PO TABS: 10.0000 mg | ORAL_TABLET | Freq: Every day | ORAL | 0 refills | Status: DC

## 2023-05-07 NOTE — Telephone Encounter (Signed)
Requested Prescriptions  Refused Prescriptions Disp Refills   busPIRone (BUSPAR) 7.5 MG tablet [Pharmacy Med Name: BUSPIRONE HCL 7.5 MG TABLET] 180 tablet 2    Sig: TAKE 1 TABLET BY MOUTH 2 TIMES DAILY.     Psychiatry: Anxiolytics/Hypnotics - Non-controlled Passed - 05/04/2023  1:31 PM      Passed - Valid encounter within last 12 months    Recent Outpatient Visits           4 days ago Anxiety   [facility] [place], [place], PA-C   3 weeks ago Anxiety   [place] [facility] Simmons-Robinson, Tawanna Cooler, MD       Future Appointments             In 4 months [place], Edmon Crape, PA-C [facility], [facility]

## 2023-08-29 ENCOUNTER — Other Ambulatory Visit: Payer: Self-pay | Admitting: Physician Assistant

## 2023-08-29 DIAGNOSIS — F419 Anxiety disorder, unspecified: Secondary | ICD-10-CM

## 2023-09-23 NOTE — Progress Notes (Signed)
Complete physical exam  Patient: Christine White   DOB: [birth year]   48 y.o. Female  MRN: [id number] Visit Date: 09/25/2023  Today's healthcare provider: Debera Lat, PA-C   Chief Complaint  Patient presents with   Annual Exam   Subjective    Christine White is a 48 y.o. female who presents today for a complete physical exam.   Discussed the use of AI scribe software for clinical note transcription with the patient, who gave verbal consent to proceed.  History of Present Illness   The patient, with a history of anxiety, shoulder injury, and knee surgeries, presents for a physical examination required by her insurance. She reports that her anxiety has improved on Lexapro, with fewer days of feeling anxious. However, she still has occasional days where she feels a little anxious. She reports that she is sleeping well.  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 She reports occasional soreness and pain in the knee. The patient's father had thyroid cancer and the mother is on thyroid medication, but the patient has not reported any thyroid-related symptoms.  The patient is physically active at work, walking and climbing steps frequently. She also does some exercises at home, including lifting barbells and doing floor pushups. The patient's diet includes vegetables, fruits, salads, and she tries to limit dressing intake. She reports having dinner between five and six o'clock, usually consisting of baked meals like chicken pie.  The patient has regular menstrual periods and has never had a Pap smear. She reports no problems with bowel movements, urination, or vaginal discharge. The patient's blood pressure was slightly high during the visit, which she attributes to coming straight from work.        Last depression screening scores    09/25/2023    8:51 AM 04/12/2023    9:28 AM  PHQ 2/9 Scores   PHQ - 2 Score 2 0  PHQ- 9 Score 7    Last fall risk screening    09/25/2023    8:51 AM  Fall Risk   Falls in the past year? 0  Number falls in past yr: 0  Injury with Fall? 0   Last Audit-C alcohol use screening     No data to display         A score of 3 or more in women, and 4 or more in men indicates increased risk for alcohol abuse, EXCEPT if all of the points are from question 1   Past Medical History:  Diagnosis Date   Anxiety    Arthritis    Past Surgical History:  Procedure Laterality Date   KNEE SURGERY Right    4-5 different   SHOULDER SURGERY     SHOULDER SURGERY Right 2015   Social History   Socioeconomic History   Marital status: Single    Spouse name: Not on file   Number of children: Not on file   Years of education: Not on file   Highest education level: Not on file  Occupational History   Not on file  Tobacco Use   Smoking status: Never   Smokeless tobacco: Never  Vaping Use   Vaping status: Never Used  Substance and Sexual Activity   Alcohol use: No   Drug use: No   Sexual activity: Not on file  Other Topics Concern   Not on file  Social History Narrative   Not on  file   Social Drivers of Corporate investment banker Strain: Not on file  Food Insecurity: Not on file  Transportation Needs: Not on file  Physical Activity: Not on file  Stress: Not on file  Social Connections: Not on file  Intimate Partner Violence: Not on file   Family Status  Relation Name Status   Mother  (Not Specified)   Father  (Not Specified)   MGM  (Not Specified)   MGF  (Not Specified)  No partnership data on file   Family History  Problem Relation Age of Onset   Thyroid disease Mother 38 - 39   Diabetes type I Mother    Anemia Mother    Thyroid cancer Father 45 - 39   Ovarian cancer Maternal Grandmother        progressed to others   Diabetes Maternal Grandmother    Diabetes Maternal Grandfather    Allergies  Allergen Reactions   Egg  Solids, Whole Hives   Hydrocodone-Acetaminophen Hives    "Loopy Feeling"   Neosporin [Neomycin-Bacitracin Zn-Polymyx] Hives   Other Itching   Vicodin [Hydrocodone-Acetaminophen] Other (See Comments)    jittery   Meloxicam Hives, Itching and Rash    Severe rash and itching all over    Patient Care Team: Ronnald Ramp, MD as PCP - General (Family Medicine)   Medications: Outpatient Medications Prior to Visit  Medication Sig   escitalopram (LEXAPRO) 10 MG tablet TAKE 1 TABLET BY MOUTH EVERY DAY   busPIRone (BUSPAR) 7.5 MG tablet Take 1 tablet (7.5 mg total) by mouth 2 (two) times daily. (Patient not taking: Reported on 09/25/2023)   [DISCONTINUED] escitalopram (LEXAPRO) 10 MG tablet Take 1 tablet (10 mg total) by mouth daily.   No facility-administered medications prior to visit.    Review of Systems  All other systems reviewed and are negative.  Except see HPI     Objective    BP 130/72   Pulse 60   Resp 18   Ht 5\' 3"  (1.6 m)   Wt 250 lb (113.4 kg)   SpO2 100%   BMI 44.29 kg/m      Physical Exam Vitals reviewed.  Constitutional:      General: She is not in acute distress.    Appearance: Normal appearance. She is well-developed. She is not ill-appearing, toxic-appearing or diaphoretic.  HENT:     Head: Normocephalic and atraumatic.     Right Ear: Tympanic membrane, ear canal and external ear normal.     Left Ear: Tympanic membrane, ear canal and external ear normal.     Nose: Nose normal. No congestion or rhinorrhea.     Mouth/Throat:     Mouth: Mucous membranes are moist.     Pharynx: Oropharynx is clear. No oropharyngeal exudate.  Eyes:     General: No scleral icterus.       Right eye: No discharge.        Left eye: No discharge.     Conjunctiva/sclera: Conjunctivae normal.     Pupils: Pupils are equal, round, and reactive to light.  Neck:     Thyroid: No thyromegaly.     Vascular: No carotid bruit.  Cardiovascular:     Rate and Rhythm:  Normal rate and regular rhythm.     Pulses: Normal pulses.     Heart sounds: Normal heart sounds. No murmur heard.    No friction rub. No gallop.  Pulmonary:     Effort: Pulmonary effort is normal. No respiratory distress.  Breath sounds: Normal breath sounds. No wheezing or rales.  Abdominal:     General: Abdomen is flat. Bowel sounds are normal. There is no distension.     Palpations: Abdomen is soft. There is no mass.     Tenderness: There is no abdominal tenderness. There is no right CVA tenderness, left CVA tenderness, guarding or rebound.     Hernia: No hernia is present.  Musculoskeletal:        General: No swelling, tenderness, deformity or signs of injury. Normal range of motion.     Cervical back: Normal range of motion and neck supple. No rigidity or tenderness.     Right lower leg: No edema.     Left lower leg: No edema.  Lymphadenopathy:     Cervical: No cervical adenopathy.  Skin:    General: Skin is warm and dry.     Coloration: Skin is not jaundiced or pale.     Findings: No bruising, erythema, lesion or rash.  Neurological:     Mental Status: She is alert and oriented to person, place, and time. Mental status is at baseline.     Gait: Gait normal.  Psychiatric:        Mood and Affect: Mood normal.        Behavior: Behavior normal.        Thought Content: Thought content normal.        Judgment: Judgment normal.      No results found for any visits on 09/25/23.  Assessment & Plan    Routine Health Maintenance and Physical Exam  Exercise Activities and Dietary recommendations  Goals   None     Immunization History  Administered Date(s) Administered   Tdap 09/25/2023    Health Maintenance  Topic Date Due   HIV Screening  Never done   Hepatitis C Screening  Never done   Pap with HPV screening  Never done   Colon Cancer Screening  Never done   COVID-19 Vaccine (1 - 2024-25 season) Never done   Flu Shot  11/12/2023*   DTaP/Tdap/Td vaccine (2 -  Td or Tdap) 09/24/2033   HPV Vaccine  Aged Out  *Topic was postponed. The date shown is not the original due date.    Discussed health benefits of physical activity, and encouraged her to engage in regular exercise appropriate for her age and condition.  Assessment and Plan    Annual physical exam (Primary) UTD on dental/ advised to proceed with regular eye exam Things to do to keep yourself healthy  - Exercise at least 30-45 minutes a day, 3-4 days a week.  - Eat a low-fat diet with lots of fruits and vegetables, up to 7-9 servings per day.  - Seatbelts can save your life. Wear them always.  - Smoke detectors on every level of your home, check batteries every year.  - Eye Doctor - have an eye exam every 1-2 years  - Safe sex - if you may be exposed to STDs, use a condom.  - Alcohol -  If you drink, do it moderately, less than 2 drinks per day.  - Health Care Power of Attorney. Choose someone to speak for you if you are not able.  - Depression is common in our stressful world.If you're feeling down or losing interest in things you normally enjoy, please come in for a visit.  - Violence - If anyone is threatening or hurting you, please call immediately.   Morbid obesity (HCC) Chronic and  stable, associated with elevated BP Ordered workup - Hemoglobin A1c - Lipid panel - CBC with Differential/Platelet - Comprehensive metabolic panel - TSH Will follow-up  Need for hepatitis C screening test Low risk screening - Hepatitis C antibody  Encounter for screening for HIV Low risk screening - HIV Antibody (routine testing w rflx)  Anxiety Chronic and stable Managed with Lexapro, reports some improvement but still has occasional anxious days. Sleep has improved. -Continue Lexapro, adjust dose as needed based on symptoms. -Consider therapy sessions for additional support.  Elevated bp reading Elevated blood pressure noted during visit, patient attributes to work stress. -Order  comprehensive blood work to assess overall health status and potential contributors to hypertension.  General Health Maintenance -Advise on diet, emphasizing the importance of a healthy breakfast and lunch, and a light dinner. Encourage cessation of eating 5-6 hours before bedtime. -Encourage continuation of current physical activity, which includes regular walking and strength exercises. -Administer Tdap vaccine today as last received in 2014. -Order TSH test due to family history of thyroid issues. -Schedule Pap smear for next visit as patient has never had one. -Schedule mammogram as last one was in 2015. -Encourage patient to schedule colonoscopy as paperwork has been received. -Schedule physical exam in one year.      No follow-ups on file.    The patient was advised to call back or seek an in-person evaluation if the symptoms worsen or if the condition fails to improve as anticipated.  I discussed the assessment and treatment plan with the patient. The patient was provided an opportunity to ask questions and all were answered. The patient agreed with the plan and demonstrated an understanding of the instructions.  I, Debera Lat, PA-C have reviewed all documentation for this visit. The documentation on 09/25/2023  for the exam, diagnosis, procedures, and orders are all accurate and complete.  Debera Lat, [facility], MMS [facility] [phone] (phone) [phone] (fax)  [facility]

## 2023-09-25 ENCOUNTER — Ambulatory Visit (INDEPENDENT_AMBULATORY_CARE_PROVIDER_SITE_OTHER): Payer: Self-pay | Admitting: Physician Assistant

## 2023-09-25 ENCOUNTER — Encounter: Payer: Self-pay | Admitting: Physician Assistant

## 2023-09-25 VITALS — BP 130/72 | HR 60 | Resp 18 | Ht 63.0 in | Wt 250.0 lb

## 2023-09-25 DIAGNOSIS — Z0000 Encounter for general adult medical examination without abnormal findings: Secondary | ICD-10-CM

## 2023-09-25 DIAGNOSIS — Z23 Encounter for immunization: Secondary | ICD-10-CM

## 2023-09-25 DIAGNOSIS — Z1159 Encounter for screening for other viral diseases: Secondary | ICD-10-CM

## 2023-09-25 DIAGNOSIS — Z0001 Encounter for general adult medical examination with abnormal findings: Secondary | ICD-10-CM

## 2023-09-25 DIAGNOSIS — Z114 Encounter for screening for human immunodeficiency virus [HIV]: Secondary | ICD-10-CM

## 2023-09-26 ENCOUNTER — Encounter: Payer: Self-pay | Admitting: Physician Assistant

## 2023-09-26 LAB — COMPREHENSIVE METABOLIC PANEL
ALT: 15 [IU]/L (ref 0–32)
AST: 16 [IU]/L (ref 0–40)
Albumin: 4.1 g/dL (ref 3.9–4.9)
Alkaline Phosphatase: 78 [IU]/L (ref 44–121)
BUN/Creatinine Ratio: 11 (ref 9–23)
BUN: 10 mg/dL (ref 6–24)
Bilirubin Total: 0.5 mg/dL (ref 0.0–1.2)
CO2: 23 mmol/L (ref 20–29)
Calcium: 9.3 mg/dL (ref 8.7–10.2)
Chloride: 100 mmol/L (ref 96–106)
Creatinine, Ser: 0.89 mg/dL (ref 0.57–1.00)
Globulin, Total: 2.6 g/dL (ref 1.5–4.5)
Glucose: 83 mg/dL (ref 70–99)
Potassium: 4.7 mmol/L (ref 3.5–5.2)
Sodium: 136 mmol/L (ref 134–144)
Total Protein: 6.7 g/dL (ref 6.0–8.5)
eGFR: 80 mL/min/{1.73_m2} (ref >59)

## 2023-09-26 LAB — CBC WITH DIFFERENTIAL/PLATELET
Basophils Absolute: 0 10*3/uL (ref 0.0–0.2)
Basos: 0 %
EOS (ABSOLUTE): 0.2 10*3/uL (ref 0.0–0.4)
Eos: 3 %
Hematocrit: 43.3 % (ref 34.0–46.6)
Hemoglobin: 14.4 g/dL (ref 11.1–15.9)
Immature Grans (Abs): 0 10*3/uL (ref 0.0–0.1)
Immature Granulocytes: 0 %
Lymphocytes Absolute: 2.1 10*3/uL (ref 0.7–3.1)
Lymphs: 33 %
MCH: 31.8 pg (ref 26.6–33.0)
MCHC: 33.3 g/dL (ref 31.5–35.7)
MCV: 96 fL (ref 79–97)
Monocytes Absolute: 0.6 10*3/uL (ref 0.1–0.9)
Monocytes: 9 %
Neutrophils Absolute: 3.5 10*3/uL (ref 1.4–7.0)
Neutrophils: 55 %
Platelets: 276 10*3/uL (ref 150–450)
RBC: 4.53 x10E6/uL (ref 3.77–5.28)
RDW: 12.3 % (ref 11.7–15.4)
WBC: 6.3 10*3/uL (ref 3.4–10.8)

## 2023-09-26 LAB — LIPID PANEL
Chol/HDL Ratio: 4.5 {ratio} — ABNORMAL HIGH (ref 0.0–4.4)
Cholesterol, Total: 203 mg/dL — ABNORMAL HIGH (ref 100–199)
HDL: 45 mg/dL (ref >39)
LDL Chol Calc (NIH): 131 mg/dL — ABNORMAL HIGH (ref 0–99)
Triglycerides: 152 mg/dL — ABNORMAL HIGH (ref 0–149)
VLDL Cholesterol Cal: 27 mg/dL (ref 5–40)

## 2023-09-26 LAB — HEMOGLOBIN A1C
Est. average glucose Bld gHb Est-mCnc: 100 mg/dL
Hgb A1c MFr Bld: 5.1 % (ref 4.8–5.6)

## 2023-09-26 LAB — HIV ANTIBODY (ROUTINE TESTING W REFLEX): HIV Screen 4th Generation wRfx: NONREACTIVE

## 2023-09-26 LAB — TSH: TSH: 3.32 u[IU]/mL (ref 0.450–4.500)

## 2023-09-26 LAB — HEPATITIS C ANTIBODY: Hep C Virus Ab: NONREACTIVE

## 2023-11-21 ENCOUNTER — Other Ambulatory Visit: Payer: Self-pay | Admitting: Physician Assistant

## 2023-11-21 DIAGNOSIS — F419 Anxiety disorder, unspecified: Secondary | ICD-10-CM

## 2023-11-22 NOTE — Telephone Encounter (Signed)
 Requested Prescriptions  Pending Prescriptions Disp Refills   escitalopram (LEXAPRO) 10 MG tablet [Pharmacy Med Name: ESCITALOPRAM 10 MG TABLET] 90 tablet 2    Sig: TAKE 1 TABLET BY MOUTH EVERY DAY     Psychiatry:  Antidepressants - SSRI Passed - 11/22/2023 10:57 AM      Passed - Valid encounter within last 6 months    Recent Outpatient Visits           1 month ago Annual physical exam   [facility] [place], [place], PA-C       Future Appointments             In 10 months Simmons-Robinson, Tawanna Cooler, MD [facility], [facility]

## 2024-06-11 ENCOUNTER — Other Ambulatory Visit: Payer: Self-pay

## 2024-06-11 DIAGNOSIS — Z1231 Encounter for screening mammogram for malignant neoplasm of breast: Secondary | ICD-10-CM

## 2024-06-12 ENCOUNTER — Ambulatory Visit
Admission: RE | Admit: 2024-06-12 | Discharge: 2024-06-12 | Disposition: A | Discharge status: Home / Self Care | Source: Ambulatory Visit | Attending: Family Medicine | Admitting: Family Medicine

## 2024-06-12 DIAGNOSIS — Z1231 Encounter for screening mammogram for malignant neoplasm of breast: Secondary | ICD-10-CM

## 2024-09-25 ENCOUNTER — Encounter: Payer: Self-pay | Admitting: Family Medicine
